# Patient Record
Sex: Female | Born: 1970 | Race: Black or African American | Hispanic: No | Marital: Single | Smoking: Never smoker
Health system: Southern US, Community
[De-identification: ages and names within clinical notes are randomized; demographics above are authoritative.]

## PROBLEM LIST (undated history)

## (undated) DIAGNOSIS — Z789 Other specified health status: Secondary | ICD-10-CM

## (undated) DIAGNOSIS — D649 Anemia, unspecified: Secondary | ICD-10-CM

## (undated) HISTORY — PX: TUBAL LIGATION: SHX77

## (undated) HISTORY — PX: CARPAL TUNNEL RELEASE: SHX101

---

## 2003-02-04 ENCOUNTER — Other Ambulatory Visit: Admission: RE | Admit: 2003-02-04 | Discharge: 2003-02-04 | Payer: Self-pay | Admitting: Obstetrics and Gynecology

## 2003-02-25 ENCOUNTER — Encounter: Payer: Self-pay | Admitting: Obstetrics and Gynecology

## 2003-02-25 ENCOUNTER — Encounter: Admission: RE | Admit: 2003-02-25 | Discharge: 2003-02-25 | Payer: Self-pay | Admitting: Obstetrics and Gynecology

## 2004-04-19 ENCOUNTER — Other Ambulatory Visit: Admission: RE | Admit: 2004-04-19 | Discharge: 2004-04-19 | Payer: Self-pay | Admitting: Obstetrics and Gynecology

## 2005-01-24 ENCOUNTER — Emergency Department (HOSPITAL_COMMUNITY): Admission: EM | Admit: 2005-01-24 | Discharge: 2005-01-24 | Payer: Self-pay | Admitting: *Deleted

## 2010-01-21 ENCOUNTER — Ambulatory Visit (HOSPITAL_BASED_OUTPATIENT_CLINIC_OR_DEPARTMENT_OTHER): Admission: RE | Admit: 2010-01-21 | Discharge: 2010-01-21 | Payer: Self-pay | Admitting: Orthopedic Surgery

## 2011-02-24 LAB — POCT HEMOGLOBIN-HEMACUE: Hemoglobin: 9.1 g/dL — ABNORMAL LOW (ref 12.0–15.0)

## 2011-04-28 ENCOUNTER — Other Ambulatory Visit: Payer: Self-pay | Admitting: Emergency Medicine

## 2011-05-03 ENCOUNTER — Ambulatory Visit
Admission: RE | Admit: 2011-05-03 | Discharge: 2011-05-03 | Disposition: A | Discharge status: Home / Self Care | Payer: BC Managed Care – PPO | Source: Ambulatory Visit | Attending: Emergency Medicine | Admitting: Emergency Medicine

## 2011-11-18 ENCOUNTER — Encounter (HOSPITAL_COMMUNITY): Payer: Self-pay | Admitting: Pharmacist

## 2011-11-25 NOTE — H&P (Signed)
  H&P DIctated 11/25/11 #[id number] DL

## 2011-11-26 NOTE — H&P (Addendum)
NAME:  Jennifer Tate, Jennifer Tate                ACCOUNT NO.:  [id number]  MEDICAL RECORD NO.:  [id number]  LOCATION:  PERIO                         FACILITY:  [facility]  PHYSICIAN:  Dineen Kid. Rana Snare, M.D.    DATE OF BIRTH:  1971/05/28  DATE OF ADMISSION:  10/25/2011 DATE OF DISCHARGE:                             HISTORY & PHYSICAL   She is going to be admitted [facility] on December 08, 2011.  HISTORY OF PRESENT ILLNESS:  Ms. Bare is a 40 year old, G2, P2, black female, status post tubal ligation with worsening problems of menorrhagia, pelvic pain, anemia, and fibroids, currently on intensive iron therapy, underwent saline infusion ultrasound showing multiple fibroids, some with submucosal component.  She desires definitive surgical intervention and presents for hysterectomy.  She states that when she does have menstrual cycle it is almost like a faucet with very heavy irregular bleeding.  She has been anemic for this for some time.  PAST MEDICAL HISTORY:  Significant for anemia.  PAST SURGICAL HISTORY:  She has had 2 cesarean sections and a tubal ligation.  She has also had carpal tunnel surgery and wisdom tooth extraction.  Currently, she is on prenatal vitamins and iron.  She has no known drug allergies.  PHYSICAL EXAMINATION:  VITAL SIGNS:  Her blood pressure is 132/80. HEART:  Regular rate and rhythm. LUNGS:  Clear to auscultation bilaterally. ABDOMEN:  Nondistended and nontender. GENITOURINARY:  Normal external genitalia, Bartholin, Skene's, urethra. Uterus is anteverted and mobile, approximately 8 week size.  Irregular surface.  No adnexal mass palpable.  IMPRESSION:  Menorrhagia, dysmenorrhea, fibroids, and anemia.  Desires definitive surgical intervention.  PLAN:  Laparoscopic-assisted vaginal hysterectomy, possible abdominal hysterectomy.  She does want preservation of the ovaries.  I discussed the procedure at length, its risks, benefits, the probability of success.  She does  want to proceed.  The risks include but not limited to risk of infection, bleeding, damage to the ureters, the ovaries, the bowel, and the bladder, risks associated with anesthesia and blood transfusion.  She does give her informed consent and wished to proceed.     Dineen Kid Rana Snare, M.D.     DCL/MEDQ  D:  11/25/2011  T:  11/26/2011  Job:  [id number]  12/07/10 0714 am Pt seen and examined.  H&P is reviewed and is current DL

## 2011-12-02 ENCOUNTER — Encounter (HOSPITAL_COMMUNITY)
Admission: RE | Admit: 2011-12-02 | Discharge: 2011-12-02 | Disposition: A | Discharge status: Home / Self Care | Payer: BC Managed Care – PPO | Source: Ambulatory Visit | Attending: Obstetrics and Gynecology | Admitting: Obstetrics and Gynecology

## 2011-12-02 ENCOUNTER — Encounter (HOSPITAL_COMMUNITY): Payer: Self-pay

## 2011-12-02 HISTORY — DX: Anemia, unspecified: D64.9

## 2011-12-02 HISTORY — DX: Other specified health status: Z78.9

## 2011-12-02 LAB — CBC
HCT: 33 % — ABNORMAL LOW (ref 36.0–46.0)
Hemoglobin: 10.5 g/dL — ABNORMAL LOW (ref 12.0–15.0)
MCH: 26.4 pg (ref 26.0–34.0)
MCHC: 31.8 g/dL (ref 30.0–36.0)
RBC: 3.97 MIL/uL (ref 3.87–5.11)

## 2011-12-02 LAB — SURGICAL PCR SCREEN: Staphylococcus aureus: NEGATIVE

## 2011-12-02 NOTE — Patient Instructions (Signed)
YOUR PROCEDURE IS SCHEDULED ON:12/08/11 ENTER THROUGH THE MAIN ENTRANCE OF [facility] AT:6am  USE DESK PHONE AND DIAL [phone] TO INFORM us OF YOUR ARRIVAL  CALL [phone] IF YOU HAVE ANY QUESTIONS OR PROBLEMS PRIOR TO YOUR ARRIVAL.  REMEMBER: DO NOT EAT OR DRINK AFTER MIDNIGHT : Wed  SPECIAL INSTRUCTIONS:none   YOU MAY BRUSH YOUR TEETH THE MORNING OF SURGERY   TAKE THESE MEDICINES THE DAY OF SURGERY WITH SIP OF WATER:none   DO NOT WEAR JEWELRY, EYE MAKEUP, LIPSTICK OR DARK FINGERNAIL POLISH DO NOT WEAR LOTIONS  DO NOT SHAVE FOR 48 HOURS PRIOR TO SURGERY  YOU WILL NOT BE ALLOWED TO DRIVE YOURSELF HOME.  NAME OF DRIVER:mother- Ree Kida

## 2011-12-07 MED ORDER — DEXTROSE 5 % IV SOLN
2.0000 g | INTRAVENOUS | Status: AC
  Administered 2011-12-08: 2 g via INTRAVENOUS
  Filled 2011-12-07: qty 2

## 2011-12-08 ENCOUNTER — Encounter (HOSPITAL_COMMUNITY): Payer: Self-pay | Admitting: Anesthesiology

## 2011-12-08 ENCOUNTER — Encounter (HOSPITAL_COMMUNITY): Payer: Self-pay | Admitting: *Deleted

## 2011-12-08 ENCOUNTER — Encounter (HOSPITAL_COMMUNITY)
Admission: RE | Disposition: A | Discharge status: Home / Self Care | Payer: Self-pay | Source: Ambulatory Visit | Attending: Obstetrics and Gynecology

## 2011-12-08 ENCOUNTER — Ambulatory Visit (HOSPITAL_COMMUNITY)
Admission: RE | Admit: 2011-12-08 | Discharge: 2011-12-09 | Disposition: A | Discharge status: Home / Self Care | Payer: BC Managed Care – PPO | Source: Ambulatory Visit | Attending: Obstetrics and Gynecology | Admitting: Obstetrics and Gynecology

## 2011-12-08 ENCOUNTER — Ambulatory Visit (HOSPITAL_COMMUNITY): Payer: BC Managed Care – PPO | Admitting: Anesthesiology

## 2011-12-08 ENCOUNTER — Other Ambulatory Visit: Payer: Self-pay | Admitting: Obstetrics and Gynecology

## 2011-12-08 DIAGNOSIS — N946 Dysmenorrhea, unspecified: Secondary | ICD-10-CM | POA: Insufficient documentation

## 2011-12-08 DIAGNOSIS — D251 Intramural leiomyoma of uterus: Secondary | ICD-10-CM | POA: Insufficient documentation

## 2011-12-08 DIAGNOSIS — N920 Excessive and frequent menstruation with regular cycle: Secondary | ICD-10-CM | POA: Insufficient documentation

## 2011-12-08 DIAGNOSIS — D649 Anemia, unspecified: Secondary | ICD-10-CM | POA: Insufficient documentation

## 2011-12-08 DIAGNOSIS — Z01818 Encounter for other preprocedural examination: Secondary | ICD-10-CM | POA: Insufficient documentation

## 2011-12-08 DIAGNOSIS — Z01812 Encounter for preprocedural laboratory examination: Secondary | ICD-10-CM | POA: Insufficient documentation

## 2011-12-08 DIAGNOSIS — N736 Female pelvic peritoneal adhesions (postinfective): Secondary | ICD-10-CM | POA: Insufficient documentation

## 2011-12-08 DIAGNOSIS — N8000 Endometriosis of the uterus, unspecified: Secondary | ICD-10-CM | POA: Insufficient documentation

## 2011-12-08 DIAGNOSIS — D252 Subserosal leiomyoma of uterus: Secondary | ICD-10-CM | POA: Insufficient documentation

## 2011-12-08 DIAGNOSIS — Z90710 Acquired absence of both cervix and uterus: Secondary | ICD-10-CM

## 2011-12-08 DIAGNOSIS — D250 Submucous leiomyoma of uterus: Secondary | ICD-10-CM | POA: Insufficient documentation

## 2011-12-08 HISTORY — PX: LAPAROSCOPIC ASSISTED VAGINAL HYSTERECTOMY: SHX5398

## 2011-12-08 SURGERY — HYSTERECTOMY, VAGINAL, LAPAROSCOPY-ASSISTED
Anesthesia: General | Site: Abdomen | Wound class: Clean Contaminated

## 2011-12-08 MED ORDER — LIDOCAINE HCL (CARDIAC) 20 MG/ML IV SOLN
INTRAVENOUS | Status: DC | PRN
  Administered 2011-12-08: 60 mg via INTRAVENOUS

## 2011-12-08 MED ORDER — NEOSTIGMINE METHYLSULFATE 1 MG/ML IJ SOLN
INTRAMUSCULAR | Status: DC | PRN
  Administered 2011-12-08: 3 mg via INTRAVENOUS
  Administered 2011-12-08: 2 mg via INTRAVENOUS

## 2011-12-08 MED ORDER — DEXAMETHASONE SODIUM PHOSPHATE 10 MG/ML IJ SOLN
INTRAMUSCULAR | Status: AC
  Filled 2011-12-08: qty 1

## 2011-12-08 MED ORDER — PROMETHAZINE HCL 25 MG/ML IJ SOLN
12.5000 mg | INTRAMUSCULAR | Status: DC | PRN
  Administered 2011-12-08: 12.5 mg via INTRAVENOUS
  Filled 2011-12-08: qty 1

## 2011-12-08 MED ORDER — NEOSTIGMINE METHYLSULFATE 1 MG/ML IJ SOLN
INTRAMUSCULAR | Status: AC
  Filled 2011-12-08: qty 10

## 2011-12-08 MED ORDER — ROCURONIUM BROMIDE 50 MG/5ML IV SOLN
INTRAVENOUS | Status: AC
  Filled 2011-12-08: qty 1

## 2011-12-08 MED ORDER — GLYCOPYRROLATE 0.2 MG/ML IJ SOLN
INTRAMUSCULAR | Status: AC
  Filled 2011-12-08: qty 1

## 2011-12-08 MED ORDER — LACTATED RINGERS IR SOLN
Status: DC | PRN
  Administered 2011-12-08: 3000 mL

## 2011-12-08 MED ORDER — ONDANSETRON HCL 4 MG/2ML IJ SOLN
INTRAMUSCULAR | Status: AC
  Filled 2011-12-08: qty 2

## 2011-12-08 MED ORDER — HYDROMORPHONE HCL PF 1 MG/ML IJ SOLN
INTRAMUSCULAR | Status: AC
  Administered 2011-12-08: 0.5 mg via INTRAVENOUS
  Filled 2011-12-08: qty 1

## 2011-12-08 MED ORDER — INDIGOTINDISULFONATE SODIUM 8 MG/ML IJ SOLN
INTRAMUSCULAR | Status: DC | PRN
  Administered 2011-12-08: 5 mL via INTRAVENOUS

## 2011-12-08 MED ORDER — BUPIVACAINE HCL (PF) 0.25 % IJ SOLN
INTRAMUSCULAR | Status: DC | PRN
  Administered 2011-12-08: 10 mL

## 2011-12-08 MED ORDER — MENTHOL 3 MG MT LOZG: 1.0000 | LOZENGE | OROMUCOSAL | Status: DC | PRN

## 2011-12-08 MED ORDER — DEXTROSE-NACL 5-0.45 % IV SOLN
INTRAVENOUS | Status: DC
  Administered 2011-12-08 – 2011-12-09 (×3): via INTRAVENOUS

## 2011-12-08 MED ORDER — ONDANSETRON HCL 4 MG/2ML IJ SOLN: 4.0000 mg | Freq: Four times a day (QID) | INTRAMUSCULAR | Status: DC | PRN

## 2011-12-08 MED ORDER — IBUPROFEN 600 MG PO TABS
600.0000 mg | ORAL_TABLET | Freq: Four times a day (QID) | ORAL | Status: DC | PRN
  Administered 2011-12-09 (×2): 600 mg via ORAL
  Filled 2011-12-08 (×2): qty 1

## 2011-12-08 MED ORDER — ONDANSETRON HCL 4 MG/2ML IJ SOLN
INTRAMUSCULAR | Status: DC | PRN
  Administered 2011-12-08: 4 mg via INTRAVENOUS

## 2011-12-08 MED ORDER — HYDROMORPHONE HCL PF 1 MG/ML IJ SOLN
0.2500 mg | INTRAMUSCULAR | Status: DC | PRN
  Administered 2011-12-08 (×3): 0.5 mg via INTRAVENOUS

## 2011-12-08 MED ORDER — NALOXONE HCL 0.4 MG/ML IJ SOLN: 0.4000 mg | INTRAMUSCULAR | Status: DC | PRN

## 2011-12-08 MED ORDER — LIDOCAINE HCL (CARDIAC) 20 MG/ML IV SOLN
INTRAVENOUS | Status: AC
  Filled 2011-12-08: qty 5

## 2011-12-08 MED ORDER — DEXAMETHASONE SODIUM PHOSPHATE 4 MG/ML IJ SOLN
INTRAMUSCULAR | Status: DC | PRN
  Administered 2011-12-08: 10 mg via INTRAVENOUS

## 2011-12-08 MED ORDER — FENTANYL CITRATE 0.05 MG/ML IJ SOLN
INTRAMUSCULAR | Status: DC | PRN
  Administered 2011-12-08 (×2): 100 ug via INTRAVENOUS
  Administered 2011-12-08: 50 ug via INTRAVENOUS

## 2011-12-08 MED ORDER — LACTATED RINGERS IV SOLN
INTRAVENOUS | Status: DC
  Administered 2011-12-08 (×2): via INTRAVENOUS

## 2011-12-08 MED ORDER — HYDROMORPHONE HCL PF 1 MG/ML IJ SOLN
INTRAMUSCULAR | Status: AC
  Filled 2011-12-08: qty 1

## 2011-12-08 MED ORDER — PROPOFOL 10 MG/ML IV EMUL
INTRAVENOUS | Status: DC | PRN
  Administered 2011-12-08: 20 mg via INTRAVENOUS
  Administered 2011-12-08: 160 mg via INTRAVENOUS
  Administered 2011-12-08: 20 mg via INTRAVENOUS

## 2011-12-08 MED ORDER — FENTANYL CITRATE 0.05 MG/ML IJ SOLN
INTRAMUSCULAR | Status: AC
  Filled 2011-12-08: qty 5

## 2011-12-08 MED ORDER — ZOLPIDEM TARTRATE 5 MG PO TABS: 5.0000 mg | ORAL_TABLET | Freq: Every evening | ORAL | Status: DC | PRN

## 2011-12-08 MED ORDER — HYDROMORPHONE 0.3 MG/ML IV SOLN
INTRAVENOUS | Status: AC
  Filled 2011-12-08: qty 25

## 2011-12-08 MED ORDER — ROCURONIUM BROMIDE 100 MG/10ML IV SOLN
INTRAVENOUS | Status: DC | PRN
  Administered 2011-12-08 (×2): 10 mg via INTRAVENOUS
  Administered 2011-12-08: 40 mg via INTRAVENOUS

## 2011-12-08 MED ORDER — INDIGOTINDISULFONATE SODIUM 8 MG/ML IJ SOLN
INTRAMUSCULAR | Status: AC
  Filled 2011-12-08: qty 5

## 2011-12-08 MED ORDER — DIPHENHYDRAMINE HCL 12.5 MG/5ML PO ELIX: 12.5000 mg | ORAL_SOLUTION | Freq: Four times a day (QID) | ORAL | Status: DC | PRN

## 2011-12-08 MED ORDER — GLYCOPYRROLATE 0.2 MG/ML IJ SOLN
INTRAMUSCULAR | Status: DC | PRN
  Administered 2011-12-08: .6 mg via INTRAVENOUS
  Administered 2011-12-08: .4 mg via INTRAVENOUS

## 2011-12-08 MED ORDER — OXYCODONE-ACETAMINOPHEN 5-325 MG PO TABS
1.0000 | ORAL_TABLET | ORAL | Status: DC | PRN
  Administered 2011-12-09 (×2): 1 via ORAL
  Filled 2011-12-08 (×3): qty 1

## 2011-12-08 MED ORDER — HYDROMORPHONE HCL PF 1 MG/ML IJ SOLN: 0.2000 mg | INTRAMUSCULAR | Status: DC | PRN

## 2011-12-08 MED ORDER — SODIUM CHLORIDE 0.9 % IJ SOLN: 9.0000 mL | INTRAMUSCULAR | Status: DC | PRN

## 2011-12-08 MED ORDER — HYDROMORPHONE 0.3 MG/ML IV SOLN
INTRAVENOUS | Status: DC
  Administered 2011-12-08: 0.3 mg via INTRAVENOUS
  Administered 2011-12-08: 12:00:00 via INTRAVENOUS
  Administered 2011-12-08: 1.5 mg via INTRAVENOUS

## 2011-12-08 MED ORDER — MIDAZOLAM HCL 2 MG/2ML IJ SOLN
INTRAMUSCULAR | Status: AC
  Filled 2011-12-08: qty 2

## 2011-12-08 MED ORDER — DIPHENHYDRAMINE HCL 50 MG/ML IJ SOLN: 12.5000 mg | Freq: Four times a day (QID) | INTRAMUSCULAR | Status: DC | PRN

## 2011-12-08 MED ORDER — PROPOFOL 10 MG/ML IV EMUL
INTRAVENOUS | Status: AC
  Filled 2011-12-08: qty 20

## 2011-12-08 MED ORDER — MIDAZOLAM HCL 5 MG/5ML IJ SOLN
INTRAMUSCULAR | Status: DC | PRN
  Administered 2011-12-08: 2 mg via INTRAVENOUS

## 2011-12-08 SURGICAL SUPPLY — 43 items
ADH SKN CLS APL DERMABOND .7 (GAUZE/BANDAGES/DRESSINGS) ×2
BLADE SURG 15 STRL LF C SS BP (BLADE) ×1 IMPLANT
BLADE SURG 15 STRL SS (BLADE) ×2
CABLE HIGH FREQUENCY MONO STRZ (ELECTRODE) IMPLANT
CATH ROBINSON RED A/P 16FR (CATHETERS) ×2 IMPLANT
CLOTH BEACON ORANGE TIMEOUT ST (SAFETY) ×2 IMPLANT
CONT PATH 16OZ SNAP LID 3702 (MISCELLANEOUS) ×2 IMPLANT
COVER MAYO STAND STRL (DRAPES) ×1 IMPLANT
COVER TABLE BACK 60X90 (DRAPES) ×2 IMPLANT
DECANTER SPIKE VIAL GLASS SM (MISCELLANEOUS) ×1 IMPLANT
DERMABOND ADVANCED (GAUZE/BANDAGES/DRESSINGS) ×2
DERMABOND ADVANCED .7 DNX12 (GAUZE/BANDAGES/DRESSINGS) IMPLANT
DRSG COVADERM PLUS 2X2 (GAUZE/BANDAGES/DRESSINGS) ×1 IMPLANT
ELECT LIGASURE LONG (ELECTRODE) ×2 IMPLANT
ELECT REM PT RETURN 9FT ADLT (ELECTROSURGICAL) ×2
ELECTRODE REM PT RTRN 9FT ADLT (ELECTROSURGICAL) IMPLANT
FORCEPS CUTTING 45CM 5MM (CUTTING FORCEPS) ×2 IMPLANT
GLOVE BIO SURGEON STRL SZ8 (GLOVE) ×2 IMPLANT
GLOVE BIOGEL PI IND STRL 6.5 (GLOVE) ×1 IMPLANT
GLOVE BIOGEL PI INDICATOR 6.5 (GLOVE) ×1
GLOVE SURG ORTHO 8.0 STRL STRW (GLOVE) ×6 IMPLANT
GOWN PREVENTION PLUS LG XLONG (DISPOSABLE) ×6 IMPLANT
NDL INSUFFLATION 14GA 120MM (NEEDLE) ×1 IMPLANT
NEEDLE INSUFFLATION 14GA 120MM (NEEDLE) ×2 IMPLANT
NS IRRIG 1000ML POUR BTL (IV SOLUTION) ×2 IMPLANT
PACK LAVH (CUSTOM PROCEDURE TRAY) ×2 IMPLANT
SET IRRIG TUBING LAPAROSCOPIC (IRRIGATION / IRRIGATOR) ×1 IMPLANT
SOLUTION ELECTROLUBE (MISCELLANEOUS) IMPLANT
STRIP CLOSURE SKIN 1/4X3 (GAUZE/BANDAGES/DRESSINGS) IMPLANT
SUT MNCRL 0 MO-4 VIOLET 18 CR (SUTURE) ×2 IMPLANT
SUT MNCRL 0 VIOLET 6X18 (SUTURE) ×1 IMPLANT
SUT MNCRL AB 0 CT1 27 (SUTURE) IMPLANT
SUT MON AB 2-0 CT1 36 (SUTURE) ×1 IMPLANT
SUT MONOCRYL 0 6X18 (SUTURE) ×1
SUT MONOCRYL 0 MO 4 18  CR/8 (SUTURE) ×1
SUT VICRYL 0 UR6 27IN ABS (SUTURE) ×2 IMPLANT
SUT VICRYL RAPIDE 3 0 (SUTURE) ×2 IMPLANT
TOWEL OR 17X24 6PK STRL BLUE (TOWEL DISPOSABLE) ×4 IMPLANT
TRAY FOLEY CATH 14FR (SET/KITS/TRAYS/PACK) ×2 IMPLANT
TROCAR Z-THREAD BLADED 11X100M (TROCAR) ×2 IMPLANT
TROCAR Z-THREAD BLADED 5X100MM (TROCAR) ×2 IMPLANT
WARMER LAPAROSCOPE (MISCELLANEOUS) ×2 IMPLANT
WATER STERILE IRR 1000ML POUR (IV SOLUTION) ×1 IMPLANT

## 2011-12-08 NOTE — Anesthesia Procedure Notes (Signed)
Procedure Name: Intubation Date/Time: 12/08/2011 7:31 AM Performed by: Isabella Bowens Pre-anesthesia Checklist: Patient identified, Emergency Drugs available, Suction available, Patient being monitored and Timeout performed Patient Re-evaluated:Patient Re-evaluated prior to inductionOxygen Delivery Method: Circle System Utilized Preoxygenation: Pre-oxygenation with 100% oxygen Intubation Type: IV induction Ventilation: Mask ventilation without difficulty Laryngoscope Size: Mac and 3 Grade View: Grade II Tube type: Oral Tube size: 7.0 mm Number of attempts: 1 Airway Equipment and Method: stylet Placement Confirmation: ETT inserted through vocal cords under direct vision,  positive ETCO2 and breath sounds checked- equal and bilateral Secured at: 20 cm Tube secured with: Tape Dental Injury: Teeth and Oropharynx as per pre-operative assessment  Difficulty Due To: Difficulty was unanticipated

## 2011-12-08 NOTE — Anesthesia Postprocedure Evaluation (Signed)
Anesthesia Post Note  Patient: Jennifer Tate  Procedure(s) Performed:  LAPAROSCOPIC ASSISTED VAGINAL HYSTERECTOMY  Anesthesia type: General  Patient location: PACU  Post pain: Pain level controlled  Post assessment: Post-op Vital signs reviewed  Last Vitals:  Filed Vitals:   12/08/11 1045  BP:   Pulse: 68  Temp: 36.8 C  Resp: 18    Post vital signs: Reviewed  Level of consciousness: sedated  Complications: No apparent anesthesia complications

## 2011-12-08 NOTE — Transfer of Care (Signed)
Immediate Anesthesia Transfer of Care Note  Patient: Jennifer Tate  Procedure(s) Performed:  LAPAROSCOPIC ASSISTED VAGINAL HYSTERECTOMY  Patient Location: PACU  Anesthesia Type: General  Level of Consciousness: awake and oriented  Airway & Oxygen Therapy: Patient Spontanous Breathing and Patient connected to nasal cannula oxygen  Post-op Assessment: Report given to PACU RN and Post -op Vital signs reviewed and stable  Post vital signs: Reviewed and stable  Complications: No apparent anesthesia complications

## 2011-12-08 NOTE — Op Note (Addendum)
Op note dictated [phone] AUB, Fibroids, pelvic pain, anemia LAVH, LOA Rana Snare, Neal EBL 200cc Findings:  Extensive pevlic adhesions, fibroids Complications:  None Drain:  Foley DL

## 2011-12-08 NOTE — Progress Notes (Signed)
Day of Surgery Procedure(s): LAPAROSCOPIC ASSISTED VAGINAL HYSTERECTOMY  Subjective: Patient reports  Doing well    Objective: I have reviewed patient's vital signs, intake and output and medications.  General: alert, cooperative and no distress  Assessment: s/p Procedure(s): LAPAROSCOPIC ASSISTED VAGINAL HYSTERECTOMY: stable and progressing well  Plan: Advance diet Encourage ambulation  LOS: 0 days    Xiadani Damman C 12/08/2011, 6:55 PM

## 2011-12-08 NOTE — Anesthesia Preprocedure Evaluation (Signed)
Anesthesia Evaluation  Patient identified by MRN, date of birth, ID band Patient awake    Reviewed: Allergy & Precautions, H&P , Patient's Chart, lab work & pertinent test results, reviewed documented beta blocker date and time   Airway Mallampati: II TM Distance: >3 FB Neck ROM: full    Dental No notable dental hx.    Pulmonary  clear to auscultation  Pulmonary exam normal       Cardiovascular regular Normal    Neuro/Psych    GI/Hepatic   Endo/Other    Renal/GU      Musculoskeletal   Abdominal   Peds  Hematology   Anesthesia Other Findings   Reproductive/Obstetrics                           Anesthesia Physical Anesthesia Plan  ASA: II  Anesthesia Plan: General   Post-op Pain Management:    Induction: Intravenous  Airway Management Planned: Oral ETT  Additional Equipment:   Intra-op Plan:   Post-operative Plan:   Informed Consent: I have reviewed the patients History and Physical, chart, labs and discussed the procedure including the risks, benefits and alternatives for the proposed anesthesia with the patient or authorized representative who has indicated his/her understanding and acceptance.   Dental Advisory Given and Dental advisory given  Plan Discussed with: CRNA and Surgeon  Anesthesia Plan Comments: (  Discussed  general anesthesia, including possible nausea, instrumentation of airway, sore throat,pulmonary aspiration, etc. I asked if the were any outstanding questions, or  concerns before we proceeded. )        Anesthesia Quick Evaluation  

## 2011-12-09 LAB — CBC
Hemoglobin: 8.4 g/dL — ABNORMAL LOW (ref 12.0–15.0)
MCH: 25.7 pg — ABNORMAL LOW (ref 26.0–34.0)
MCHC: 30.7 g/dL (ref 30.0–36.0)
MCV: 83.8 fL (ref 78.0–100.0)
RBC: 3.27 MIL/uL — ABNORMAL LOW (ref 3.87–5.11)

## 2011-12-09 MED ORDER — OXYCODONE-ACETAMINOPHEN 5-500 MG PO CAPS: 1.0000 | ORAL_CAPSULE | ORAL | Status: AC | PRN

## 2011-12-09 NOTE — Op Note (Signed)
NAMENIAMBI, SMOAK                ACCOUNT NO.:  [id number]  MEDICAL RECORD NO.:  [id number]  LOCATION:  9306                          FACILITY:  [facility]  PHYSICIAN:  Dineen Kid. Rana Snare, M.D.    DATE OF BIRTH:  [birth year]  DATE OF PROCEDURE:  12/08/2011 DATE OF DISCHARGE:                              OPERATIVE REPORT   PREOPERATIVE DIAGNOSES:  Menorrhagia, dysmenorrhea, fibroids, and anemia.  POSTOP DIAGNOSIS:  Menorrhagia, dysmenorrhea, fibroids, anemia, pelvic adhesions.  SURGEON:  Dineen Kid. Rana Snare, MD  ASSISTANT:  Freddy Finner, M.D.  ANESTHESIA:  General endotracheal.  PROCEDURE:  Laparoscopic-assisted vaginal hysterectomy with extensive lysis of pelvic adhesions.  INDICATIONS:  Ms. Prashad is a 41 year old, G2, P2, black female, status post tubal ligation, worsening problems, menorrhagia, pelvic pain, anemia, fibroids, currently on iron therapy.  Underwent saline infusion ultrasound showing multiple fibroids, some with a submucosal component. She desires definitive surgical intervention and presents for hysterectomy.  She states that when she has a menstrual cycle, it is almost like a faucet, very irregular heavy bleeding.  The risks and benefits of procedure were discussed at length and informed consent was obtained.  FINDINGS AT TIME OF SURGERY:  Enlarged uterus consistent with 8 week size fibroid uterus.  Adhesions from the omentum to the anterior abdominal wall and omentum to the fundus down through the anterior surface over the bladder and the uterus.  Also the right side of the uterus is densely adhered to the right pelvic sidewall, normal-appearing appendix, normal-appearing liver.  DESCRIPTION OF PROCEDURE:  After adequate analgesia, the patient was placed in dorsal lithotomy position.  She was sterilely prepped and draped.  The bladder was sterilely drained.  Graves speculum was placed. A Hulka tenaculum was placed on the cervix.  A 1 cm infraumbilical skin incision  was made.  A Veress needle was inserted.  The abdomen was insufflated with dullness to percussion and 11 mm trocar was inserted. The laparoscope was inserted.  The above findings were noted.  A 5 mm trocar was inserted to the left of midline 2 fingerbreadths above the pubic symphysis under direct visualization.  Careful dissection using Gyrus cutting forceps were used to dissect and cut the omentum from the anterior abdominal wall down to the level of the uterus, care taken to avoid any injury to the bowel and achieved good hemostasis.  The omentum was then dissected off the anterior surface of the uterus down to the level of the bladder until the anterior surface was cleaned from bowel and omentum adhesions.  Again care taken to avoid injury to the bowel. The left utero-ovarian ligament was identified, ligated with Gyrus cutting forceps, dissected across the utero-ovarian ligament, down across the round ligament to the inferior portion of broad ligament of the left tube and ovary following laterally with good hemostasis achieved and care taken to avoid the underlying ureter.  The right utero- ovarian ligament was dissected, ligated, and with the right ovary following bilaterally.  The broad ligament, however, was densely adhered to the pelvic sidewall.  So careful dissection using care to stay along the uterine border using Gyrus cutting forceps were used to cut and dissect along the uterus  down along the broad ligament, freeing the right side of the uterus from the pelvic sidewall.  Care taken to avoid the underlying vessels and underlying ureter.  The uterovesical junction was then identified.  A bladder flap was created.  The abdomen was then desufflated, the legs repositioned, weighted speculum placed in the vagina.  The posterior colpotomy was performed.  The cervix was then circumscribed with Bovie cautery.  A long weighted speculum was then placed.  LigaSure instrument was used to  ligate across the uterosacral ligaments bilaterally, cardinal ligaments bilaterally, bladder pillars bilaterally.  The anterior vaginal mucosa was then dissected off the anterior surface of the cervix and anterior peritoneum was entered sharply and a Deaver retractor was placed under the bladder.  The patient was given indigo carmine and Foley catheter placed with good return of clear blue urine and no evidence of any injury to the bladder. The inferior portions of the broad ligament were then ligated with LigaSure instrument, dissected with the Mayo scissors.  The uterus was removed.  Posterior peritoneum was closed in a pursestring fashion using 0 Monocryl suture.  The uterosacral ligaments were identified, ligated with 0 Monocryl suture in a figure-of-eight fashion.  The fascial mucosa was then closed in a vertical fashion using figure-of-eights of 0 Monocryl suture plicating the uterosacral ligaments in the midline and achieved good hemostasis.  The legs were repositioned.  Abdomen reinsufflated.  A Nezhat suction irrigator was used to irrigate the abdomen and pelvis.  Good hemostasis was achieved.  The ovaries appeared to be normal.  No injury to bowel or bladder was noted.  The ureters were identified bilaterally and noted to be good peristalsis.  Small peritoneal edge bleeders were coagulated with bipolar cautery.  After copious amount of irrigation, adequate hemostasis was assured.  The abdomen was desufflated, trocars removed.  The infraumbilical skin incision was closed with 0 Vicryl interrupted suture of the fascia, 3-0 Vicryl Rapide subcuticular suture.  The 5 mm site was closed with 3-0 Vicryl Rapide subcuticular suture with Dermabond.  The incisions were then injected with 0.25% Marcaine, total of 10 mL used.  The patient was transferred to recovery room in stable condition.  Sponge and instrument count was normal x3.  Estimated blood loss 200 mL.  The patient received 2 g  of cefotetan preoperatively.     Dineen Kid Rana Snare, M.D.     DCL/MEDQ  D:  12/08/2011  T:  12/09/2011  Job:  [id number]

## 2011-12-09 NOTE — Anesthesia Postprocedure Evaluation (Signed)
  Anesthesia Post-op Note  Patient: Jennifer Tate  Procedure(s) Performed:  LAPAROSCOPIC ASSISTED VAGINAL HYSTERECTOMY  Patient Location: PACU and Women's Unit  Anesthesia Type: General  Level of Consciousness: awake, alert , oriented, patient cooperative and responds to stimulation  Airway and Oxygen Therapy: Patient Spontanous Breathing  Post-op Pain: mild  Post-op Assessment: Post-op Vital signs reviewed, Patient's Cardiovascular Status Stable, Respiratory Function Stable, No signs of Nausea or vomiting and Pain level controlled  Post-op Vital Signs: stable  Complications: No apparent anesthesia complications

## 2011-12-09 NOTE — Discharge Summary (Signed)
Physician Discharge Summary  Patient ID: CAYCI MCNABB MRN: [id number] DOB/AGE: 02/28/1971 41 y.o.  Admit date: 12/08/2011 Discharge date: 12/09/2011  Admission Diagnoses:Fibroids, AUB, Dysmenorrhea  Discharge Diagnoses: Same plus pelvic adhesiions Active Problems:  * No active hospital problems. *    Discharged Condition: good  Hospital Course: Pt was admitted and underwent a LAVH with lysis of adhesions.  Her procedure was uncomplicated.  Her postop care was also unremarkable as she had good return of bowel function, voiding, tolerating po well and passing flatus and pain well controlled on po meds by HD#!Marland Kitchen  Her incisions were clean and intact and HGB Stable.  Consults: none  Significant Diagnostic Studies:   Treatments: IV hydration, antibiotics: cefotetan and surgery: LAVH  Discharge Exam: Blood pressure 111/65, pulse 93, temperature 99.7 F (37.6 C), temperature source Oral, resp. rate 18, height 4\' 11"  (1.499 m), weight 75.297 kg (166 lb), last menstrual period 12/07/2011, SpO2 97.00%. Incision/Wound:  Clean dry and intact with BS+  Disposition: Home with FU 2 weeks  Discharge Orders    Future Orders Please Complete By Expires   Diet general      Increase activity slowly      Call MD for:  temperature >100.4      Call MD for:  persistant nausea and vomiting      Call MD for:  severe uncontrolled pain      Call MD for:  redness, tenderness, or signs of infection (pain, swelling, redness, odor or green/yellow discharge around incision site)      Call MD for:  difficulty breathing, headache or visual disturbances      Discharge instructions      Comments:   Follow up in the office as scheduled in 2 weeks     Medication List  As of 12/09/2011  2:43 PM   START taking these medications         oxyCODONE-acetaminophen 5-500 MG per capsule   Commonly known as: TYLOX   Take 1 capsule by mouth every 4 (four) hours as needed for pain.         CONTINUE taking these  medications         ferrous sulfate 325 (65 FE) MG tablet          Where to get your medications    These are the prescriptions that you need to pick up.   You may get these medications from any pharmacy.         oxyCODONE-acetaminophen 5-500 MG per capsule             Signed: Izmael Duross C 12/09/2011, 2:43 PM

## 2011-12-09 NOTE — Progress Notes (Signed)
1 Day Post-Op Procedure(s): LAPAROSCOPIC ASSISTED VAGINAL HYSTERECTOMY  Subjective: Patient reports tolerating PO, + flatus and no problems voiding.    Objective: I have reviewed patient's vital signs, intake and output, medications and labs.  General: alert, cooperative, icteric and no distress GI: soft, non-tender; bowel sounds normal; no masses,  no organomegaly and normal findings:  Vaginal Bleeding: none and minimal  Assessment: s/p Procedure(s): LAPAROSCOPIC ASSISTED VAGINAL HYSTERECTOMY: stable, progressing well and tolerating diet  Plan: Advance to PO medication Discontinue IV fluids Discharge home  LOS: 1 day    Jennifer Tate C 12/09/2011, 10:01 AM

## 2011-12-09 NOTE — Addendum Note (Signed)
Addendum  created 12/09/11 0755 by Truitt Leep, CRNA   Modules edited:Notes Section

## 2011-12-12 ENCOUNTER — Encounter (HOSPITAL_COMMUNITY): Payer: Self-pay | Admitting: Obstetrics and Gynecology

## 2012-02-23 ENCOUNTER — Telehealth: Payer: Self-pay | Admitting: Hematology and Oncology

## 2012-02-23 NOTE — Telephone Encounter (Signed)
S/w pt re appt appt for 3/22 @ 9:30 am w/LO.

## 2012-02-23 NOTE — Telephone Encounter (Signed)
Referred by Dr. Candice Camp Dx- Anemia

## 2012-02-24 ENCOUNTER — Encounter: Payer: Self-pay | Admitting: Hematology and Oncology

## 2012-02-24 ENCOUNTER — Ambulatory Visit (HOSPITAL_BASED_OUTPATIENT_CLINIC_OR_DEPARTMENT_OTHER): Payer: BC Managed Care – PPO | Admitting: Hematology and Oncology

## 2012-02-24 ENCOUNTER — Telehealth: Payer: Self-pay | Admitting: Hematology and Oncology

## 2012-02-24 ENCOUNTER — Ambulatory Visit: Payer: BC Managed Care – PPO

## 2012-02-24 ENCOUNTER — Ambulatory Visit (HOSPITAL_BASED_OUTPATIENT_CLINIC_OR_DEPARTMENT_OTHER): Payer: BC Managed Care – PPO | Admitting: Lab

## 2012-02-24 VITALS — BP 111/75 | HR 72 | Temp 98.5°F | Ht 61.0 in | Wt 170.3 lb

## 2012-02-24 DIAGNOSIS — Z90710 Acquired absence of both cervix and uterus: Secondary | ICD-10-CM

## 2012-02-24 DIAGNOSIS — N920 Excessive and frequent menstruation with regular cycle: Secondary | ICD-10-CM

## 2012-02-24 DIAGNOSIS — D509 Iron deficiency anemia, unspecified: Secondary | ICD-10-CM

## 2012-02-24 DIAGNOSIS — D539 Nutritional anemia, unspecified: Secondary | ICD-10-CM

## 2012-02-24 LAB — COMPREHENSIVE METABOLIC PANEL
ALT: 9 U/L (ref 0–35)
AST: 13 U/L (ref 0–37)
Albumin: 4.2 g/dL (ref 3.5–5.2)
Alkaline Phosphatase: 82 U/L (ref 39–117)
Calcium: 9.5 mg/dL (ref 8.4–10.5)
Chloride: 105 mEq/L (ref 96–112)
Potassium: 4.3 mEq/L (ref 3.5–5.3)
Sodium: 137 mEq/L (ref 135–145)
Total Protein: 7.7 g/dL (ref 6.0–8.3)

## 2012-02-24 LAB — CBC WITH DIFFERENTIAL/PLATELET
BASO%: 1 % (ref 0.0–2.0)
EOS%: 0.6 % (ref 0.0–7.0)
HCT: 34.5 % — ABNORMAL LOW (ref 34.8–46.6)
MCH: 27.1 pg (ref 25.1–34.0)
MCHC: 32.2 g/dL (ref 31.5–36.0)
MONO#: 0.3 10*3/uL (ref 0.1–0.9)
NEUT%: 62.5 % (ref 38.4–76.8)
RBC: 4.1 10*6/uL (ref 3.70–5.45)
RDW: 15 % — ABNORMAL HIGH (ref 11.2–14.5)
WBC: 5.2 10*3/uL (ref 3.9–10.3)
lymph#: 1.6 10*3/uL (ref 0.9–3.3)
nRBC: 0 % (ref 0–0)

## 2012-02-24 LAB — FERRITIN: Ferritin: 14 ng/mL (ref 10–291)

## 2012-02-24 LAB — URINALYSIS, MICROSCOPIC - CHCC
Glucose: NEGATIVE g/dL
Ketones: NEGATIVE mg/dL
Nitrite: NEGATIVE
Protein: NEGATIVE mg/dL
Specific Gravity, Urine: 1.015 (ref 1.003–1.035)

## 2012-02-24 LAB — IRON AND TIBC: UIBC: 359 ug/dL (ref 125–400)

## 2012-02-24 NOTE — Progress Notes (Signed)
This office note has been dictated.

## 2012-02-24 NOTE — Progress Notes (Signed)
CC:   Jennifer Tate. Jennifer Tate, M.D.  IDENTIFYING STATEMENT:  The patient is a 41 year old woman seen at the request of Dr. Rana Tate with anemia.  HISTORY OF PRESENT ILLNESS:  The patient is of general good medical health but has had a long history for menorrhagia with fibroids and anemia.  Reviewing the chart she had a CBC drawn at around the time of her surgery and was found to have a hemoglobin and hematocrit of 8.4 and 37.4 respectively.  On 12/08/2011 she underwent a lap assisted vaginal partial hysterectomy with lysis of extensive lesions.  She reports she had some bleeding postop.  She was placed on oral iron and she is currently taking over-the-counter iron twice daily.  It makes her feel nauseous with constipation.  She feels very physically tired especially at the end of the day.  She has not had any further issues with bleeding.  She denies melena.  Her weight is stable.  A recent CBC on 02/21/2012 noted a white cell count of 4.2, hemoglobin 11.2, hematocrit 36.8, platelets 416.  Iron stores noted an iron of 50, TIBC 53.  PAST MEDICAL HISTORY: 1. Anemia. 2. Status post vaginal hysterectomy. 3. History of right carpal tunnel surgery.  MEDICATIONS:  Ferrous sulfate 325 b.i.d.  ALLERGIES:  None.  SOCIAL HISTORY:  Denies alcohol, tobacco use.  The patient is single with 2 children.  She works at [employer].  FAMILY HISTORY:  Negative for oncologic or hematologic malignancies. Mother and cousins had anemia all secondary to menorrhagia and fibroids.  REVIEW OF SYSTEMS:  Denies fever, chills, night sweats, anorexia, weight loss.  GI:  Denies nausea, vomiting, abdominal pain, diarrhea, melena, hematochezia.  GU:  Denies dysuria, hematuria, nocturia, frequency. Cardiovascular:  Denies chest pain, PND, orthopnea, ankle swelling. Respiratory:  Denies cough, hemoptysis, wheeze, shortness of breath. Skin:  No bruising or bleeding.  PHYSICAL EXAMINATION:  General:  The patient is a well-appearing,  well- nourished woman in no distress.  Vitals:  Pulse 72, blood pressure 101/75, temperature 98.5, respirations 20, weight 170 pounds.  HEENT: Head is atraumatic, normocephalic.  Sclerae anicteric.  Pupils equal, round, reactive to light.  Mouth moist without ulcerations, thrush or lesions.  Neck:  Supple without adenopathy.  Chest:  Demonstrates good entry bilaterally and is clear to both percussion and auscultation. Cardiovascular:  Reveals first and second heart sounds present, no added sounds or murmurs.  Abdomen:  Soft, nontender.  No hepatomegaly.  No masses.  Extremities:  No calf tenderness.  Pulses present and symmetrical.  Lymph nodes:  No palpable cervical, axillary, inguinal adenopathy.  CNS:  Nonfocal.  IMPRESSION AND PLAN:  Jennifer Tate is a pleasant 41 year old woman with iron deficiency anemia felt to be secondary to menorrhagia.  She has undergone a partial vaginal hysterectomy on 12/08/2011.  She is on oral iron, which is causing some GI upset.  She may be a candidate for IV iron, but before doing so I would like for her to return to lab to repeat a CBC and obtain iron studies particularly ferritin level and if her ferritin level is low enough she will be invited to receive IV iron; if not, will recommend that she continue with iron as she is doing.  We will also review her morphology and obtain urinalysis.  I also suggested fecal occult blood testing x3.  I also encouraged well-balanced diet.  I spent more than half the time discussing etiology and management.  We also discussed the side effects of IV iron, which  is primarily infusional.  She had a number of questions which I answered.  I spent more than half the time coordinating care.    ______________________________ Jennifer Tate, M.D. LIO/MEDQ  D:  02/24/2012  T:  02/24/2012  Job:  [id number]

## 2012-02-24 NOTE — Patient Instructions (Signed)
Patient to follow up as instructed.   Current Outpatient Prescriptions  Medication Sig Dispense Refill  . ferrous sulfate 325 (65 FE) MG tablet Take 325 mg by mouth 2 (two) times daily.              March 2013  Sunday Monday Tuesday Wednesday Thursday Friday Saturday                  1   2    3   4   5   6   7   8   9    10   11   12   13   14   15   16    17   18   19   20   21   22    FINANCIAL COUNSELING   9:30 AM  (30 min.)  [facility]  [facility]   NEW PATIENT 60  10:00 AM  (60 min.)  Laurice Record, MD  [facility] 23    24   25   26   27   28   29   30    31                            April 2013  Sunday Monday Tuesday Wednesday Thursday Friday Saturday      1   2   3   4   5   6    7   8   9   10   11   12   13    14   15   16   17   18   19   20    21  Happy Birthday!   22   23   24   25   26   27    28   29    30

## 2012-02-24 NOTE — Telephone Encounter (Signed)
sent pt to lab gv pt appt for 03/26.

## 2012-02-28 ENCOUNTER — Ambulatory Visit (HOSPITAL_BASED_OUTPATIENT_CLINIC_OR_DEPARTMENT_OTHER): Payer: BC Managed Care – PPO

## 2012-02-28 VITALS — BP 102/63 | HR 75 | Temp 98.6°F

## 2012-02-28 DIAGNOSIS — D539 Nutritional anemia, unspecified: Secondary | ICD-10-CM

## 2012-02-28 MED ORDER — SODIUM CHLORIDE 0.9 % IV SOLN
1020.0000 mg | Freq: Once | INTRAVENOUS | Status: AC
  Administered 2012-02-28: 1020 mg via INTRAVENOUS
  Filled 2012-02-28: qty 34

## 2012-02-28 MED ORDER — SODIUM CHLORIDE 0.9 % IV SOLN
Freq: Once | INTRAVENOUS | Status: AC
  Administered 2012-02-28: 09:00:00 via INTRAVENOUS

## 2012-02-28 NOTE — Progress Notes (Signed)
Patient provided with education regarding first treatment of feraheme and information to take home. Tolerated treatment well with no complaints. Educated to call office with any questions or concerns. Discharged and ambulating.

## 2012-09-17 ENCOUNTER — Other Ambulatory Visit (HOSPITAL_COMMUNITY): Payer: Self-pay | Admitting: Obstetrics and Gynecology

## 2012-09-17 DIAGNOSIS — E010 Iodine-deficiency related diffuse (endemic) goiter: Secondary | ICD-10-CM

## 2012-09-19 ENCOUNTER — Ambulatory Visit (HOSPITAL_COMMUNITY)
Admission: RE | Admit: 2012-09-19 | Discharge: 2012-09-19 | Disposition: A | Discharge status: Home / Self Care | Payer: BC Managed Care – PPO | Source: Ambulatory Visit | Attending: Obstetrics and Gynecology | Admitting: Obstetrics and Gynecology

## 2012-09-19 DIAGNOSIS — E010 Iodine-deficiency related diffuse (endemic) goiter: Secondary | ICD-10-CM

## 2012-09-19 DIAGNOSIS — E049 Nontoxic goiter, unspecified: Secondary | ICD-10-CM | POA: Insufficient documentation

## 2013-01-23 ENCOUNTER — Ambulatory Visit: Payer: BC Managed Care – PPO

## 2013-01-23 ENCOUNTER — Ambulatory Visit (INDEPENDENT_AMBULATORY_CARE_PROVIDER_SITE_OTHER): Payer: BC Managed Care – PPO | Admitting: Family Medicine

## 2013-01-23 VITALS — BP 101/70 | HR 67 | Temp 98.4°F | Resp 17 | Ht 61.0 in | Wt 169.0 lb

## 2013-01-23 DIAGNOSIS — D649 Anemia, unspecified: Secondary | ICD-10-CM

## 2013-01-23 DIAGNOSIS — M549 Dorsalgia, unspecified: Secondary | ICD-10-CM

## 2013-01-23 LAB — COMPREHENSIVE METABOLIC PANEL
ALT: 9 U/L (ref 0–35)
AST: 13 U/L (ref 0–37)
Albumin: 4 g/dL (ref 3.5–5.2)
CO2: 26 mEq/L (ref 19–32)
Calcium: 9.6 mg/dL (ref 8.4–10.5)
Chloride: 107 mEq/L (ref 96–112)
Creat: 0.7 mg/dL (ref 0.50–1.10)
Potassium: 4.3 mEq/L (ref 3.5–5.3)

## 2013-01-23 LAB — POCT CBC
Granulocyte percent: 50.9 %G (ref 37–80)
Hemoglobin: 11.5 g/dL — AB (ref 12.2–16.2)
MCH, POC: 29.8 pg (ref 27–31.2)
MCV: 92.1 fL (ref 80–97)
MID (cbc): 0.3 (ref 0–0.9)
MPV: 9.3 fL (ref 0–99.8)
POC MID %: 6.5 %M (ref 0–12)
Platelet Count, POC: 400 10*3/uL (ref 142–424)
RBC: 3.86 M/uL — AB (ref 4.04–5.48)
WBC: 4.4 10*3/uL — AB (ref 4.6–10.2)

## 2013-01-23 MED ORDER — OXAPROZIN 600 MG PO TABS: ORAL_TABLET | ORAL | Status: DC

## 2013-01-23 MED ORDER — METHOCARBAMOL 500 MG PO TABS: ORAL_TABLET | ORAL | Status: DC

## 2013-01-23 NOTE — Progress Notes (Signed)
Subjective: 42 year old lady who is here with a complaint of pain in her right back. He's been hurting her for about 2 weeks. It's a deep hurting below the right scapula. She does not know what caused it started happening. When she sits or stands in a certain position it will start giving her a deep aching. She bends forward to try and get relief from it. She has not been taking any medicine. She does not have menstrual cycles because she's had a partial hysterectomy she denies any nausea vomiting or abdominal bloating. She's not on any regular medicines.  Objective: Points to below her right scapula is in place of origin of the pain. Good range of motion of her spine. Nontender to percussion. Nontender to palpation. Does not have any rashes. Abdomen soft without masses or tenderness. Straight leg raise test negative. When she just sits on the exam table she starts feeling the discomfort coming on.  Assessment: Right mid back pain, atypical, etiology unclear.  Plan: Dorsal spine series CBC Cmet  [facility] reading (PRIMARY) by  Dr. Alwyn Ren Normal t spine  Results for orders placed in visit on 01/23/13  POCT CBC      Result Value Range   WBC 4.4 (*) 4.6 - 10.2 K/uL   Lymph, poc 1.9  0.6 - 3.4   POC LYMPH PERCENT 42.6  10 - 50 %L   MID (cbc) 0.3  0 - 0.9   POC MID % 6.5  0 - 12 %M   POC Granulocyte 2.2  2 - 6.9   Granulocyte percent 50.9  37 - 80 %G   RBC 3.86 (*) 4.04 - 5.48 M/uL   Hemoglobin 11.5 (*) 12.2 - 16.2 g/dL   HCT, POC 16.1 (*) 09.6 - 47.9 %   MCV 92.1  80 - 97 fL   MCH, POC 29.8  27 - 31.2 pg   MCHC 32.3  31.8 - 35.4 g/dL   RDW, POC 04.5     Platelet Count, POC 400  142 - 424 K/uL   MPV 9.3  0 - 99.8 fL   .  Marland Kitchen assessment: Back pain, etiology unclear Anemia  Plan: Muscle relaxants, anti-inflammatory medication, and she is to take some iron.

## 2013-01-23 NOTE — Patient Instructions (Signed)
Take the muscle relaxant and anti-inflammatory medication as ordered for your back. If you consider out any activity that specifically seems to aggravate it, you should try to minimize that activity.  Take iron over-the-counter one daily for about 3 months. Advise getting your blood rechecked after that.  Return if not improving

## 2013-01-25 ENCOUNTER — Encounter: Payer: Self-pay | Admitting: *Deleted

## 2013-01-28 IMAGING — US US SOFT TISSUE HEAD/NECK
1 series · 14 of 25 positions shown · non-contrast
Comparison: None

CLINICAL DATA: Thyromegaly

THYROID ULTRASOUND
TECHNIQUE: Ultrasound examination of the thyroid gland and adjacent
soft tissues was performed.

[Series 1: us soft tissue head/neck · 0.08mm/px · 14 of 40 slices shown]
[im 1/40]
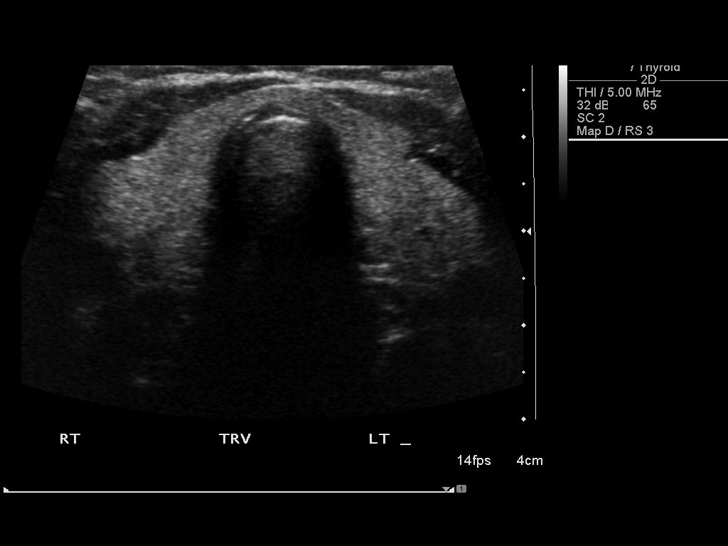
[im 4/40]
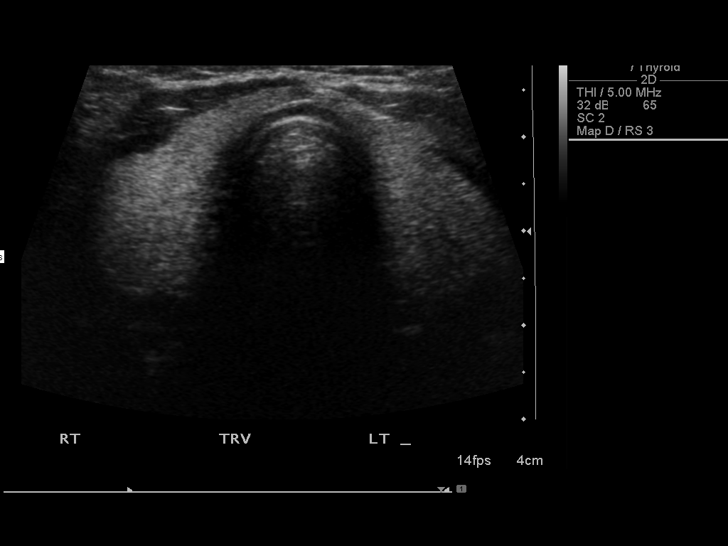
[im 7/40]
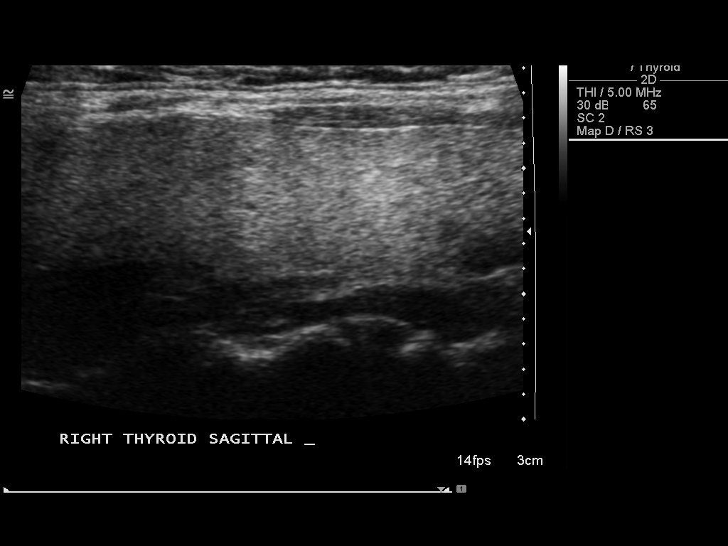
[im 10/40]
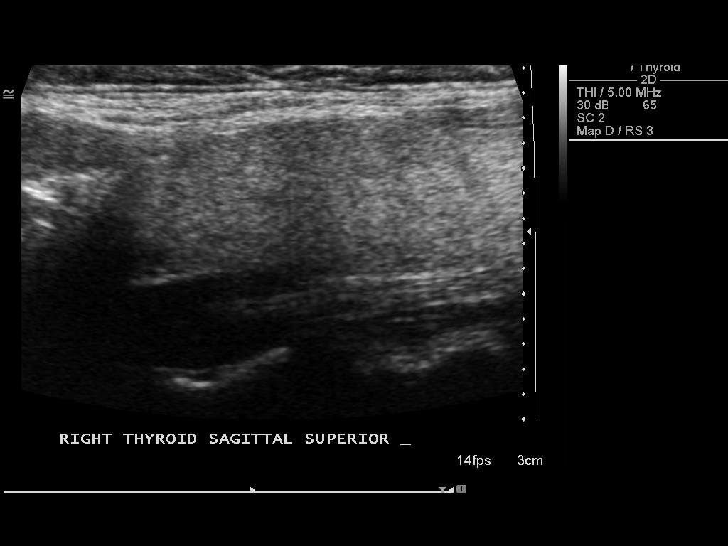
[im 14/40]
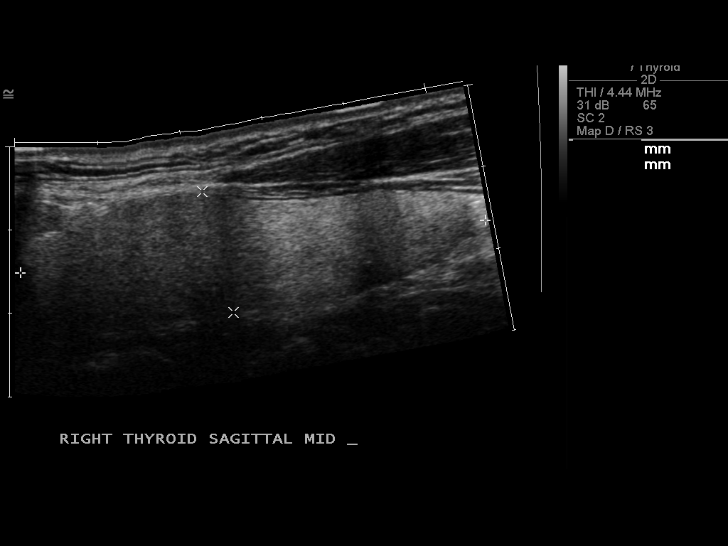
[im 15/40]
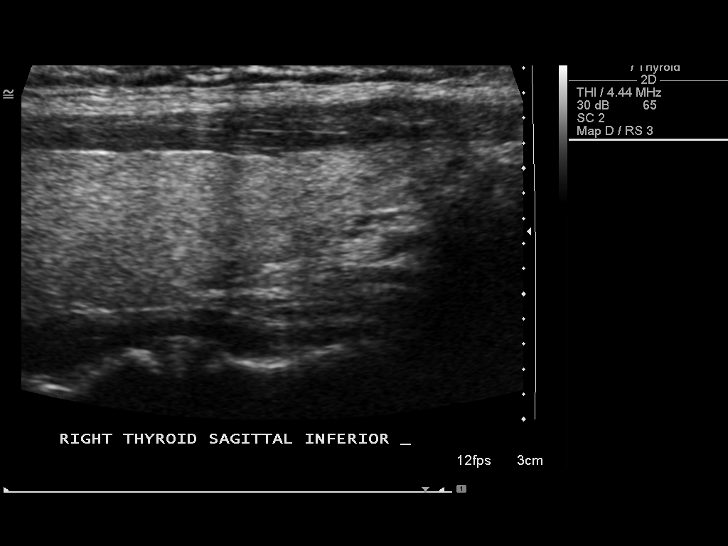
[im 18/40]
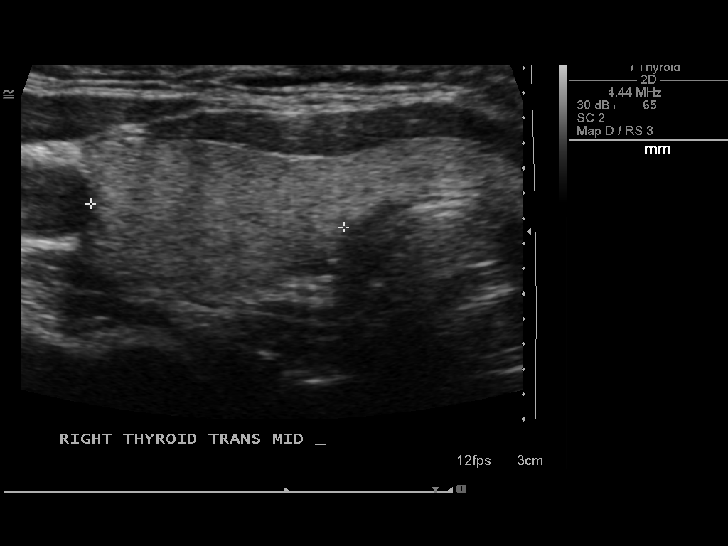
[im 22/40]
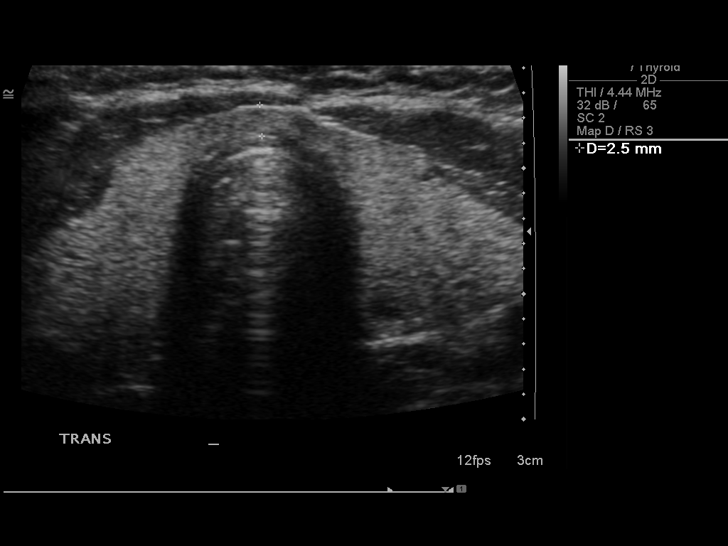
[im 25/40]
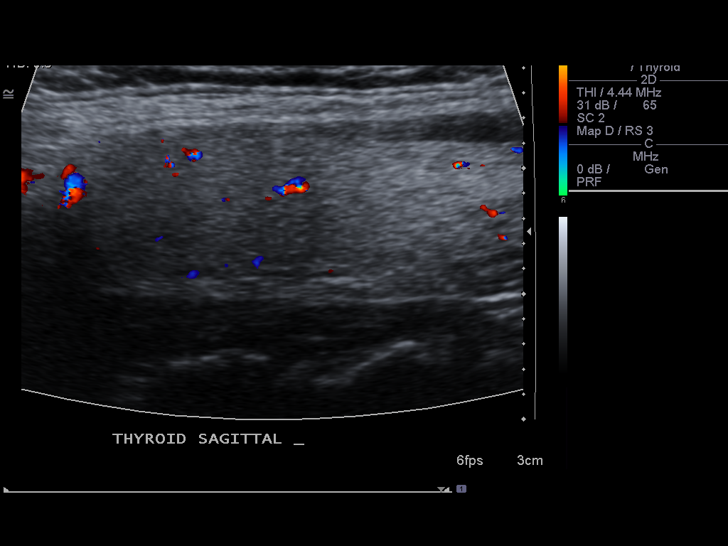
[im 27/40]
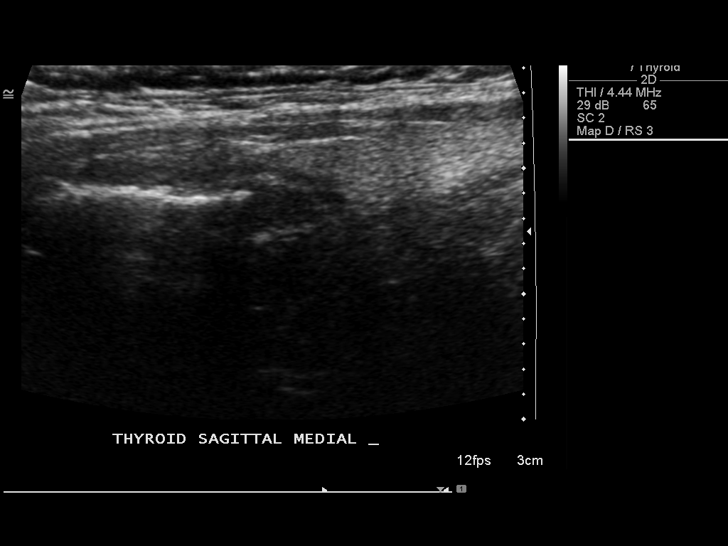
[im 30/40]
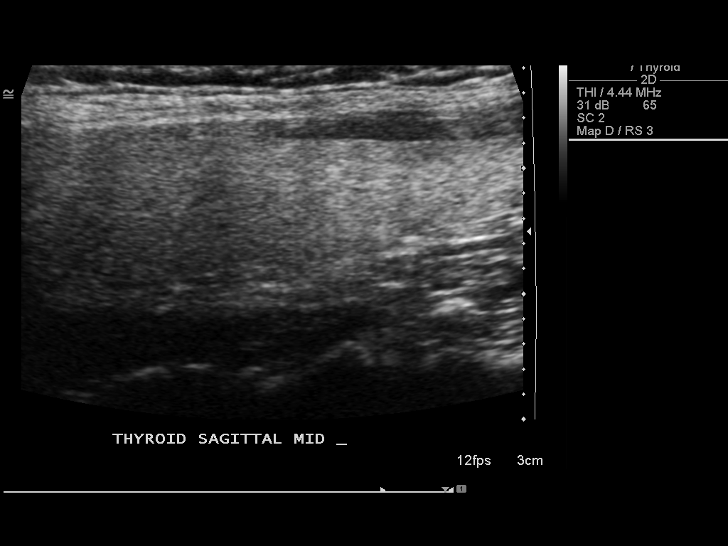
[im 33/40]
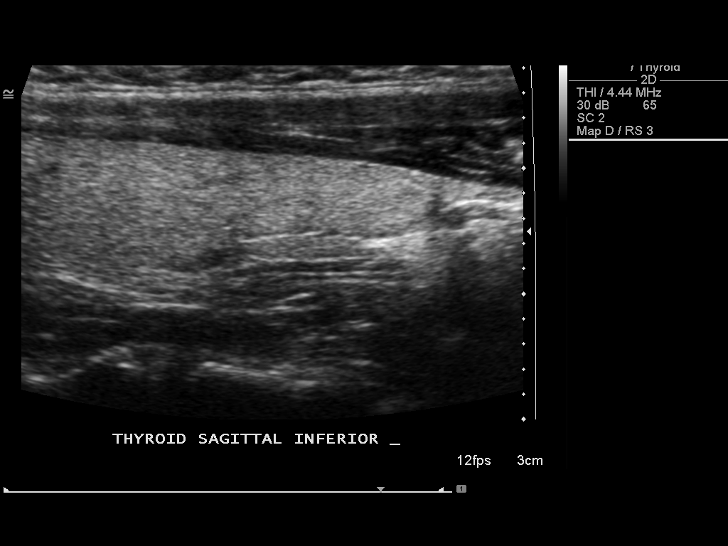
[im 36/40]
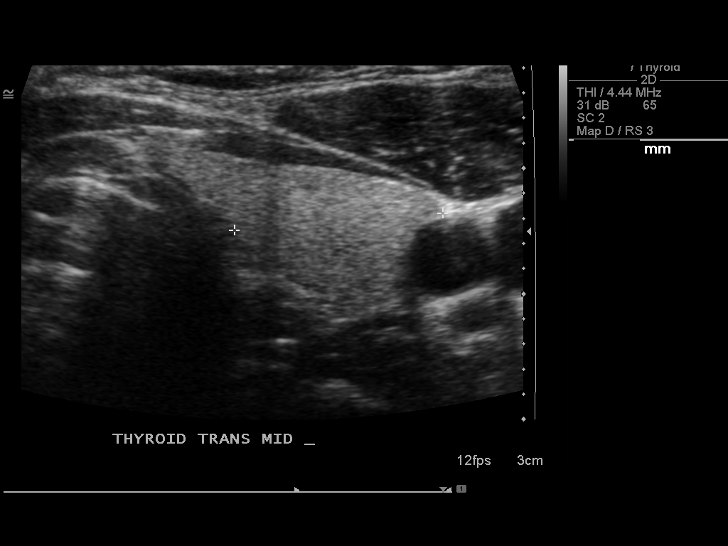
[im 40/40]
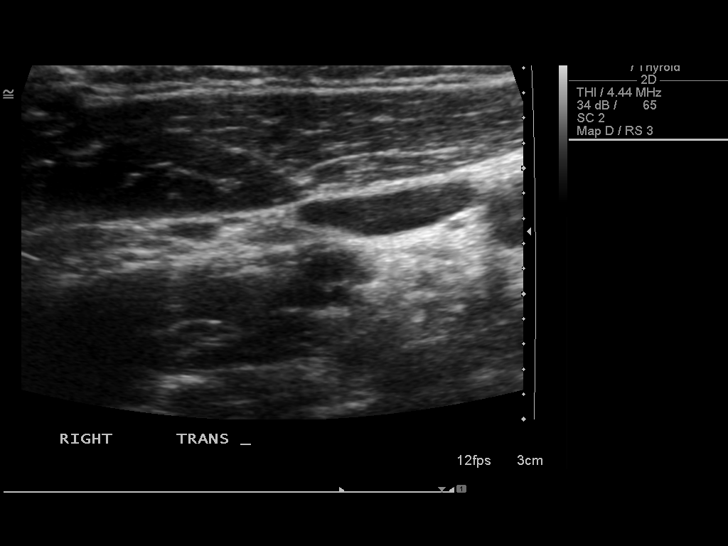

[14 of 25 positions shown; findings below may reference images not displayed]

FINDINGS: Right thyroid lobe:  5.6 x 1.5 x 2.0 cm.  Normal parenchymal
echogenicity.
Left thyroid lobe:  5.3 x 1.1 x 1.7 cm.  Normal parenchymal
echogenicity.
Isthmus:  3 mm thick

Focal nodules:  No focal solid mass lesions, cysts or
calcifications.

Lymphadenopathy:  None identified
IMPRESSION: Unremarkable thyroid ultrasound.

## 2013-12-05 ENCOUNTER — Ambulatory Visit (INDEPENDENT_AMBULATORY_CARE_PROVIDER_SITE_OTHER): Payer: BC Managed Care – PPO | Admitting: Family Medicine

## 2013-12-05 VITALS — BP 126/80 | HR 99 | Temp 99.5°F | Resp 17 | Ht 61.5 in | Wt 179.0 lb

## 2013-12-05 DIAGNOSIS — J209 Acute bronchitis, unspecified: Secondary | ICD-10-CM

## 2013-12-05 DIAGNOSIS — R05 Cough: Secondary | ICD-10-CM

## 2013-12-05 DIAGNOSIS — R059 Cough, unspecified: Secondary | ICD-10-CM

## 2013-12-05 MED ORDER — AZITHROMYCIN 250 MG PO TABS: ORAL_TABLET | ORAL | Status: DC

## 2013-12-05 MED ORDER — HYDROCODONE-HOMATROPINE 5-1.5 MG/5ML PO SYRP: ORAL_SOLUTION | ORAL | Status: DC

## 2013-12-05 NOTE — Progress Notes (Signed)
Subjective:    Patient ID: Jennifer Tate, female    DOB: [birth year], 43 y.o.   MRN: [id number]  HPI Jennifer Tate is a 43 y.o. female Cold symptoms for about 2 weeks - sniffling, runny nose, sneezing, but now past 2-3 days more deep cough, intermittent clear phlegm. Hurts in throat to cough, sore at times in chest with cough only. No dyspnea. No fever. No myalgias. Eating/drinking ok.   No known sick contacts.   Tx: mucinex last night, slept some with this.   No flu vaccine this year.  Review of Systems  Constitutional: Negative for fever and chills.  HENT: Positive for sore throat (with cough only. ). Negative for sinus pressure.   Respiratory: Positive for cough. Negative for shortness of breath.   Cardiovascular: Negative for chest pain (more throat with cough. ).  Skin: Negative for rash.   As above.     Objective:   Physical Exam  Vitals reviewed. Constitutional: She is oriented to person, place, and time. She appears well-developed and well-nourished. No distress.  HENT:  Head: Normocephalic and atraumatic.  Right Ear: Hearing, tympanic membrane, external ear and ear canal normal.  Left Ear: Hearing, tympanic membrane, external ear and ear canal normal.  Nose: Nose normal.  Mouth/Throat: Oropharynx is clear and moist. No oropharyngeal exudate.  Eyes: Conjunctivae and EOM are normal. Pupils are equal, round, and reactive to light.  Cardiovascular: Normal rate, regular rhythm, normal heart sounds and intact distal pulses.   No murmur heard. Pulmonary/Chest: Effort normal and breath sounds normal. No respiratory distress. She has no wheezes. She has no rhonchi.  Neurological: She is alert and oriented to person, place, and time.  Skin: Skin is warm and dry. No rash noted.  Psychiatric: She has a normal mood and affect. Her behavior is normal.    Filed Vitals:   12/05/13 0904  BP: 126/80  Pulse: 99  Temp: 99.5 F (37.5 C)  TempSrc: Oral  Resp: 17  Height: 5'  1.5" (1.562 m)  Weight: 179 lb (81.194 kg)  SpO2: 97%      Assessment & Plan:   [name] is a 43 y.o. female Acute bronchitis - Plan: HYDROcodone-homatropine (HYCODAN) 5-1.5 MG/5ML syrup, azithromycin (ZITHROMAX) 250 MG tablet  Cough - Plan: HYDROcodone-homatropine (HYCODAN) 5-1.5 MG/5ML syrup, azithromycin (ZITHROMAX) 250 MG tablet  2 weeks of sx's now with slight interval worsening - early bronchitis. Sx care as discussed below, start Zpak, hycodan if needed at night - SED. Mucinex. Note OOW if needed tomorrow, rtc precautions including fever, dyspnea, or worsening sx's discussed.   Meds ordered this encounter  Medications  . HYDROcodone-homatropine (HYCODAN) 5-1.5 MG/5ML syrup    Sig: 59m by mouth a bedtime as needed for cough.    Dispense:  120 mL    Refill:  0  . azithromycin (ZITHROMAX) 250 MG tablet    Sig: Take 2 pills by mouth on day 1, then 1 pill by mouth per day on days 2 through 5.    Dispense:  6 each    Refill:  0   Patient Instructions  Over the counter mucinex or mucinex DM during the day for cough, Hycodan cough syrup at night if needed. drink plenty of fluids. Return to the clinic or go to the nearest emergency room if any of your symptoms worsen or new symptoms occur. Bronchitis Bronchitis is the body's way of reacting to injury and/or infection (inflammation) of the bronchi. Bronchi are the air tubes  that extend from the windpipe into the lungs. If the inflammation becomes severe, it may cause shortness of breath. CAUSES  Inflammation may be caused by:  A virus.  Germs (bacteria).  Dust.  Allergens.  Pollutants and many other irritants. The cells lining the bronchial tree are covered with tiny hairs (cilia). These constantly beat upward, away from the lungs, toward the mouth. This keeps the lungs free of pollutants. When these cells become too irritated and are unable to do their job, mucus begins to develop. This causes the characteristic cough of  bronchitis. The cough clears the lungs when the cilia are unable to do their job. Without either of these protective mechanisms, the mucus would settle in the lungs. Then you would develop pneumonia. Smoking is a common cause of bronchitis and can contribute to pneumonia. Stopping this habit is the single most important thing you can do to help yourself. TREATMENT   Your caregiver may prescribe an antibiotic if the cough is caused by bacteria. Also, medicines that open up your airways make it easier to breathe. Your caregiver may also recommend or prescribe an expectorant. It will loosen the mucus to be coughed up. Only take over-the-counter or prescription medicines for pain, discomfort, or fever as directed by your caregiver.  Removing whatever causes the problem (smoking, for example) is critical to preventing the problem from getting worse.  Cough suppressants may be prescribed for relief of cough symptoms.  Inhaled medicines may be prescribed to help with symptoms now and to help prevent problems from returning.  For those with recurrent (chronic) bronchitis, there may be a need for steroid medicines. SEEK IMMEDIATE MEDICAL CARE IF:   During treatment, you develop more pus-like mucus (purulent sputum).  You have a fever.  You become progressively more ill.  You have increased difficulty breathing, wheezing, or shortness of breath. It is necessary to seek immediate medical care if you are elderly or sick from any other disease. MAKE SURE YOU:   Understand these instructions.  Will watch your condition.  Will get help right away if you are not doing well or get worse. Document Released: 11/21/2005 Document Revised: 07/24/2013 Document Reviewed: 07/16/2013 [facility] Patient Information 2014 [place].

## 2013-12-05 NOTE — Patient Instructions (Signed)
Over the counter mucinex or mucinex DM during the day for cough, Hycodan cough syrup at night if needed. drink plenty of fluids. Return to the clinic or go to the nearest emergency room if any of your symptoms worsen or new symptoms occur. Bronchitis Bronchitis is the body's way of reacting to injury and/or infection (inflammation) of the bronchi. Bronchi are the air tubes that extend from the windpipe into the lungs. If the inflammation becomes severe, it may cause shortness of breath. CAUSES  Inflammation may be caused by:  A virus.  Germs (bacteria).  Dust.  Allergens.  Pollutants and many other irritants. The cells lining the bronchial tree are covered with tiny hairs (cilia). These constantly beat upward, away from the lungs, toward the mouth. This keeps the lungs free of pollutants. When these cells become too irritated and are unable to do their job, mucus begins to develop. This causes the characteristic cough of bronchitis. The cough clears the lungs when the cilia are unable to do their job. Without either of these protective mechanisms, the mucus would settle in the lungs. Then you would develop pneumonia. Smoking is a common cause of bronchitis and can contribute to pneumonia. Stopping this habit is the single most important thing you can do to help yourself. TREATMENT   Your caregiver may prescribe an antibiotic if the cough is caused by bacteria. Also, medicines that open up your airways make it easier to breathe. Your caregiver may also recommend or prescribe an expectorant. It will loosen the mucus to be coughed up. Only take over-the-counter or prescription medicines for pain, discomfort, or fever as directed by your caregiver.  Removing whatever causes the problem (smoking, for example) is critical to preventing the problem from getting worse.  Cough suppressants may be prescribed for relief of cough symptoms.  Inhaled medicines may be prescribed to help with symptoms now  and to help prevent problems from returning.  For those with recurrent (chronic) bronchitis, there may be a need for steroid medicines. SEEK IMMEDIATE MEDICAL CARE IF:   During treatment, you develop more pus-like mucus (purulent sputum).  You have a fever.  You become progressively more ill.  You have increased difficulty breathing, wheezing, or shortness of breath. It is necessary to seek immediate medical care if you are elderly or sick from any other disease. MAKE SURE YOU:   Understand these instructions.  Will watch your condition.  Will get help right away if you are not doing well or get worse. Document Released: 11/21/2005 Document Revised: 07/24/2013 Document Reviewed: 07/16/2013 [facility] Patient Information 2014 [place].

## 2015-09-02 ENCOUNTER — Ambulatory Visit (INDEPENDENT_AMBULATORY_CARE_PROVIDER_SITE_OTHER): Payer: BLUE CROSS/BLUE SHIELD | Admitting: Physician Assistant

## 2015-09-02 ENCOUNTER — Encounter: Payer: Self-pay | Admitting: Physician Assistant

## 2015-09-02 VITALS — BP 127/83 | HR 87 | Temp 98.4°F | Resp 16 | Ht 61.4 in | Wt 182.8 lb

## 2015-09-02 DIAGNOSIS — H6123 Impacted cerumen, bilateral: Secondary | ICD-10-CM | POA: Diagnosis not present

## 2015-09-02 NOTE — Progress Notes (Signed)
   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  SUBJECTIVE  Jennifer Tate is a 44 y.o. well appearing female presenting for the chief complaint of "left ear being blocked up" and a change in hearing on the left side which she describes as muffled. Reports this started on 5 days ago after she accidentally push some ear wax toward her ear with her fingernail.  Denies fever, chills, ear pain, throat pain, and nasal symptoms.      She  has a past medical history of Anemia and No pertinent past medical history.    Medications reviewed and updated by myself where necessary, and exist elsewhere in the encounter.   Jennifer Tate has No Known Allergies. She  reports that she has never smoked. She does not have any smokeless tobacco history on file. She reports that she does not drink alcohol or use illicit drugs. She  reports that she currently engages in sexual activity. She reports using the following method of birth control/protection: None. The patient  has past surgical history that includes Carpal tunnel release; Cesarean section; Tubal ligation; and Laparoscopic assisted vaginal hysterectomy (12/08/2011).  Her family history includes Kidney disease in her maternal grandmother.  ROS  Per HPI  OBJECTIVE  Her  height is 5' 1.4" (1.56 m) and weight is 182 lb 12.8 oz (82.918 kg). Her oral temperature is 98.4 F (36.9 C). Her blood pressure is 127/83 and her pulse is 87. Her respiration is 16.  The patient's body mass index is 34.07 kg/(m^2).  Physical Exam  Constitutional: She is oriented to person, place, and time.  HENT:  Right Ear: Hearing and external ear normal.  Left Ear: External ear normal.  Ears:  Nose: Mucosal edema (bluish hue) present.  Mouth/Throat: Uvula is midline and oropharynx is clear and moist.  Eyes: EOM are normal. Pupils are equal, round, and  reactive to light.  Cardiovascular: Normal rate.   Respiratory: Effort normal.  Neurological: She is alert and oriented to person, place, and time.    No results found for this or any previous visit (from the past 24 hour(s)).  ASSESSMENT & PLAN  Jennifer Tate was seen today for left ear feels blocked.  Diagnoses and all orders for this visit:  Cerumen impaction, bilateral: Post lavage both ears clear and TM pearly grey.  Hearing normal bilaterally.  -     Ear wax removal   The patient was advised to call or come back to clinic if she does not see an improvement in symptoms, or worsens with the above plan.   Philis Fendt, MHS, PA-C [facility] 09/02/2015 1:35 PM

## 2016-01-11 ENCOUNTER — Other Ambulatory Visit: Payer: Self-pay

## 2016-01-11 ENCOUNTER — Encounter: Payer: Self-pay | Admitting: Podiatry

## 2016-01-11 ENCOUNTER — Ambulatory Visit (INDEPENDENT_AMBULATORY_CARE_PROVIDER_SITE_OTHER): Payer: BLUE CROSS/BLUE SHIELD | Admitting: Podiatry

## 2016-01-11 VITALS — BP 128/76 | HR 81 | Ht 59.0 in | Wt 180.0 lb

## 2016-01-11 DIAGNOSIS — M216X1 Other acquired deformities of right foot: Secondary | ICD-10-CM | POA: Diagnosis not present

## 2016-01-11 DIAGNOSIS — M722 Plantar fascial fibromatosis: Secondary | ICD-10-CM | POA: Diagnosis not present

## 2016-01-11 DIAGNOSIS — M216X2 Other acquired deformities of left foot: Secondary | ICD-10-CM

## 2016-01-11 DIAGNOSIS — M21969 Unspecified acquired deformity of unspecified lower leg: Secondary | ICD-10-CM | POA: Diagnosis not present

## 2016-01-11 NOTE — Patient Instructions (Signed)
Seen for left heel pain. Noted of unstable first metatarsal left. Metatarsal binder (sm) dispensed x 1. May benefit from custom orthotics.

## 2016-01-11 NOTE — Progress Notes (Signed)
SUBJECTIVE: 45 y.o. year old female presents complaining of pain in right heel off and on for over a year. She works for [employer] and on feet much especially in the morning.   REVIEW OF SYSTEMS: Pertinent items noted in HPI and remainder of comprehensive ROS otherwise negative.  OBJECTIVE: DERMATOLOGIC EXAMINATION: No abnormal skin lesions noted. VASCULAR EXAMINATION OF LOWER LIMBS: Pedal pulses: All pedal pulses are palpable with normal pulsation.  Capillary Filling times within 3 seconds in all digits.  No edema or erythema noted. Temperature gradient from tibial crest to dorsum of foot is within normal bilateral.  NEUROLOGIC EXAMINATION OF THE LOWER LIMBS: All epicritic and tactile sensations intact and normal.  MUSCULOSKELETAL EXAMINATION: Positive for excess sagittal plane motion of the first ray L>R. Forefoot varus and STJ hyperpronation with loading of foot. Pain in left heel with direct pressure.  RADIOGRAPHIC STUDIES:  General overview: Negative of Osteopenia Absence of soft tissue swelling. Absence of acute osseous or articular surfaces of forefoot or rearfoot.  AP View:  Long first metatarsal without other gross deformities. Lateral view:  Pronated foot with severely elevated first metatarsal L>R. Positive of plantar calcaneal spur left.  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  ASSESSMENT: 1. Plantar fasciitis left. 2. Elevated first ray bilateral L>R. 3. STJ hyperpronation bilateral.  PLAN: Reviewed clinical findings and available treatment options. Patient wants to postpone injection. Metatarsal binder (sm) dispensed x 1. Need custom orthotics.

## 2018-02-20 ENCOUNTER — Ambulatory Visit (HOSPITAL_COMMUNITY)
Admission: EM | Admit: 2018-02-20 | Discharge: 2018-02-20 | Disposition: A | Discharge status: Home / Self Care | Payer: Self-pay | Attending: Family Medicine | Admitting: Family Medicine

## 2018-02-20 ENCOUNTER — Encounter (HOSPITAL_COMMUNITY): Payer: Self-pay | Admitting: Emergency Medicine

## 2018-02-20 DIAGNOSIS — G44319 Acute post-traumatic headache, not intractable: Secondary | ICD-10-CM

## 2018-02-20 MED ORDER — BUTALBITAL-APAP-CAFFEINE 50-325-40 MG PO TABS: 1.0000 | ORAL_TABLET | Freq: Four times a day (QID) | ORAL | 0 refills | Status: AC | PRN
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
## 2019-07-31 ENCOUNTER — Other Ambulatory Visit: Payer: Self-pay

## 2019-07-31 DIAGNOSIS — Z20822 Contact with and (suspected) exposure to covid-19: Secondary | ICD-10-CM

## 2019-08-01 LAB — NOVEL CORONAVIRUS, NAA: SARS-CoV-2, NAA: NOT DETECTED

## 2021-10-30 ENCOUNTER — Encounter: Payer: Self-pay | Admitting: Hematology and Oncology

## 2021-10-30 ENCOUNTER — Other Ambulatory Visit: Payer: Self-pay

## 2021-10-30 ENCOUNTER — Ambulatory Visit
Admission: EM | Admit: 2021-10-30 | Discharge: 2021-10-30 | Disposition: A | Discharge status: Home / Self Care | Payer: BC Managed Care – PPO | Attending: Physician Assistant | Admitting: Physician Assistant

## 2021-10-30 DIAGNOSIS — J40 Bronchitis, not specified as acute or chronic: Secondary | ICD-10-CM

## 2021-10-30 MED ORDER — PREDNISONE 20 MG PO TABS: 40.0000 mg | ORAL_TABLET | Freq: Every day | ORAL | 0 refills | Status: AC

## 2021-10-30 NOTE — ED Triage Notes (Signed)
2 week h/o cough and intermittent sore throat and hoarseness. Two days of chills, HA and 1 day of fatigue that has resolved. Today, Pt mainly c/o lingering cough that worsens at night. Has been taking theraflu, other otc meds and drinking teas with relief. Denies dysphagia, v/d. Pt is covid vaccinated but not boosted.

## 2021-10-30 NOTE — ED Provider Notes (Signed)
[facility]    CSN: [id number] Arrival date & time: 10/30/21  7564      History   Chief Complaint Chief Complaint  Patient presents with   Cough    HPI Jennifer Tate is a 50 y.o. female.   Patient here today for evaluation of 2-week history of cough.  She reports she is also developed some hoarseness.  Initially she did have a sore scratchy throat but this is resolved.  She did also have fevers initially but these have also resolved.  Cough seems to be worse at nighttime.  She denies any vomiting or diarrhea.  She has tried over-the-counter treatment as well as warm teas with some relief of symptoms.  The history is provided by the patient.  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      Home Medications    Prior to Admission medications   Medication Sig Start Date End Date Taking? Authorizing Provider  predniSONE (DELTASONE) 20 MG tablet Take 2 tablets (40 mg total) by mouth daily with breakfast for 5 days. 10/30/21 11/04/21 Yes Francene Finders, PA-C    Family History Family History  Problem Relation Age of Onset   Kidney disease Maternal  Grandmother     Social History Social History   Tobacco Use   Smoking status: Never   Smokeless tobacco: Never  Substance Use Topics   Alcohol use: No    Alcohol/week: 0.0 standard drinks   Drug use: No     Allergies   Patient has no known allergies.   Review of Systems Review of Systems  Constitutional:  Negative for chills and fever.  HENT:  Positive for congestion and voice change. Negative for ear pain and sore throat.   Eyes:  Negative for discharge and redness.  Respiratory:  Positive for cough. Negative for shortness of breath and wheezing.   Gastrointestinal:  Negative for abdominal pain, diarrhea, nausea and vomiting.    Physical Exam Triage Vital Signs ED Triage Vitals  Enc Vitals Group     BP 10/30/21 1150 127/85     Pulse Rate 10/30/21 1150 90     Resp 10/30/21 1150 18     Temp 10/30/21 1150 98.2 F (36.8 C)     Temp Source 10/30/21 1150 Oral     SpO2 10/30/21 1150 95 %     Weight --      Height --      Head Circumference --      Peak Flow --      Pain Score 10/30/21 1158 0  Pain Loc --      Pain Edu? --      Excl. in [place]? --    No data found.  Updated Vital Signs BP 127/85 (BP Location: Left Arm)   Pulse 90   Temp 98.2 F (36.8 C) (Oral)   Resp 18   LMP 12/07/2011   SpO2 95%      Physical Exam Vitals and nursing note reviewed.  Constitutional:      General: She is not in acute distress.    Appearance: Normal appearance. She is not ill-appearing.  HENT:     Head: Normocephalic and atraumatic.     Nose: Nose normal. No congestion or rhinorrhea.     Mouth/Throat:     Mouth: Mucous membranes are moist.     Pharynx: No oropharyngeal exudate or posterior oropharyngeal erythema.  Eyes:     Conjunctiva/sclera: Conjunctivae normal.  Cardiovascular:     Rate and Rhythm: Normal rate and regular rhythm.     Heart sounds: Normal heart sounds. No murmur heard. Pulmonary:     Effort: Pulmonary effort is normal. No respiratory distress.      Breath sounds: Normal breath sounds. No wheezing, rhonchi or rales.  Skin:    General: Skin is warm and dry.  Neurological:     Mental Status: She is alert.  Psychiatric:        Mood and Affect: Mood normal.        Thought Content: Thought content normal.     UC Treatments / Results  Labs (all labs ordered are listed, but only abnormal results are displayed) Labs Reviewed - No data to display  EKG   Radiology No results found.  Procedures Procedures (including critical care time)  Medications Ordered in UC Medications - No data to display  Initial Impression / Assessment and Plan / UC Course  I have reviewed the triage vital signs and the nursing notes.  Pertinent labs & imaging results that were available during my care of the patient were reviewed by me and considered in my medical decision making (see chart for details).    Suspect likely bronchitis and will treat with steroid burst.  Recommended follow-up if symptoms fail to improve or worsen.  Final Clinical Impressions(s) / UC Diagnoses   Final diagnoses:  Bronchitis   Discharge Instructions   None    ED Prescriptions     Medication Sig Dispense Auth. Provider   predniSONE (DELTASONE) 20 MG tablet Take 2 tablets (40 mg total) by mouth daily with breakfast for 5 days. 10 tablet Francene Finders, PA-C      PDMP not reviewed this encounter.   Francene Finders, PA-C 10/30/21 1310

## 2024-02-22 ENCOUNTER — Ambulatory Visit
Admission: RE | Admit: 2024-02-22 | Discharge: 2024-02-22 | Disposition: A | Discharge status: Home / Self Care | Payer: Self-pay | Source: Ambulatory Visit

## 2024-02-22 ENCOUNTER — Ambulatory Visit: Admitting: Radiology

## 2024-02-22 VITALS — BP 148/95 | HR 110 | Temp 98.8°F | Resp 17

## 2024-02-22 DIAGNOSIS — R053 Chronic cough: Secondary | ICD-10-CM

## 2024-02-22 DIAGNOSIS — B349 Viral infection, unspecified: Secondary | ICD-10-CM

## 2024-02-22 MED ORDER — BENZONATATE 200 MG PO CAPS: 200.0000 mg | ORAL_CAPSULE | Freq: Three times a day (TID) | ORAL | 0 refills | Status: AC | PRN

## 2024-02-22 MED ORDER — IBUPROFEN 800 MG PO TABS: 800.0000 mg | ORAL_TABLET | Freq: Three times a day (TID) | ORAL | 0 refills | Status: AC

## 2024-02-22 MED ORDER — AZELASTINE HCL 0.1 % NA SOLN: 1.0000 | Freq: Two times a day (BID) | NASAL | 1 refills | Status: AC

## 2024-02-22 NOTE — ED Triage Notes (Signed)
 Pt was sick with cough, runny nose and scratchy throat about 3 weeks ago. Today she presents for persistent cough that wont go away and is worse at night.

## 2024-02-22 NOTE — ED Provider Notes (Signed)
 [facility]  Note:  This document was prepared using Dragon voice recognition software and may include unintentional dictation errors.  MRN: [id number] DOB: 05/01/1971  Subjective:   Jennifer Tate is a 53 y.o. female presenting for persistent cough, nasal congestion, scratchy throat x 3 weeks.  Patient reports that cough is much worse at night and mucus production first thing in the morning is more significant.  No chest pain, shortness of breath, wheezing, dizziness, diarrhea, nausea/vomiting.  Patient states that she has not been taking any over-the-counter medication to treat symptoms.  Patient states she recently went to [place] to visit her sister and when she came back her cough became worse.  Denies any known sick exposures  No current facility-administered medications for this encounter.  Current Outpatient Medications:    azelastine (ASTELIN) 0.1 % nasal spray, Place 1 spray into both nostrils 2 (two) times daily. Use in each nostril as directed, Disp: 30 mL, Rfl: 1   benzonatate (TESSALON) 200 MG capsule, Take 1 capsule (200 mg total) by mouth 3 (three) times daily as needed for cough., Disp: 20 capsule, Rfl: 0   ibuprofen (ADVIL) 800 MG tablet, Take 1 tablet (800 mg total) by mouth 3 (three) times daily., Disp: 21 tablet, Rfl: 0   No Known Allergies  Past Medical History:  Diagnosis Date   Anemia    No pertinent past medical history      Past Surgical History:  Procedure Laterality Date   CARPAL TUNNEL RELEASE     CESAREAN SECTION     x 2   LAPAROSCOPIC ASSISTED VAGINAL HYSTERECTOMY  12/08/2011   Procedure: LAPAROSCOPIC ASSISTED VAGINAL HYSTERECTOMY;  Surgeon: Turner Daniels, MD;  Location: [facility];  Service: Gynecology;  Laterality: N/A;   TUBAL LIGATION      Family History  Problem Relation Age of Onset   Kidney disease Maternal Grandmother     Social History   Tobacco Use   Smoking status: Never   Smokeless tobacco: Never  Substance Use  Topics   Alcohol use: No    Alcohol/week: 0.0 standard drinks of alcohol   Drug use: No    ROS Refer to HPI for ROS details.  Objective:   Vitals: BP (!) 148/95 (BP Location: Right Arm)   Pulse (!) 110   Temp 98.8 F (37.1 C) (Oral)   Resp 17   LMP 12/07/2011   SpO2 95%   Physical Exam Vitals and nursing note reviewed.  Constitutional:      General: She is not in acute distress.    Appearance: Normal appearance. She is well-developed. She is not ill-appearing or toxic-appearing.  HENT:     Head: Normocephalic.     Nose: Rhinorrhea present. No congestion.     Mouth/Throat:     Mouth: Mucous membranes are moist.     Pharynx: Oropharynx is clear.  Eyes:     Extraocular Movements: Extraocular movements intact.     Conjunctiva/sclera: Conjunctivae normal.  Cardiovascular:     Rate and Rhythm: Normal rate and regular rhythm.     Heart sounds: No murmur heard. Pulmonary:     Effort: Pulmonary effort is normal. No respiratory distress.     Breath sounds: Normal breath sounds. No stridor. No wheezing, rhonchi or rales.  Skin:    General: Skin is warm and dry.  Neurological:     General: No focal deficit present.     Mental Status: She is alert and oriented to person, place, and time.  Psychiatric:        Mood and Affect: Mood normal.        Behavior: Behavior normal.     Procedures  No results found for this or any previous visit (from the past 24 hours).  Assessment and Plan :   PDMP not reviewed this encounter.  1. Acute viral syndrome   2. Persistent cough for 3 weeks or longer    1. Acute viral syndrome (Primary) - benzonatate (TESSALON) 200 MG capsule; Take 1 capsule (200 mg total) by mouth 3 (three) times daily as needed for cough.   - azelastine (ASTELIN) 0.1 % nasal spray; Place 1 spray into both nostrils 2 (two) times daily. Use in each nostril as directed   - ibuprofen (ADVIL) 800 MG tablet; Take 1 tablet (800 mg total) by mouth 3 (three) times  daily. -Continue to monitor symptoms for any change in severity if there is any escalation of current symptoms or development of new symptoms return to urgent care or follow-up in ER for further evaluation and management.  2. Persistent cough for 3 weeks or longer - DG Chest 2 View x-ray performed in UC shows no acute cardiopulmonary processes, no significant sign validation or pneumonia.  Final radiologist read still pending if there is any abnormality noted on final report he will be contacted and appropriate treatment provided.  Lucky Cowboy   [place], [place], [place] 02/22/24 [phone]

## 2024-02-22 NOTE — Discharge Instructions (Addendum)
 1. Acute viral syndrome (Primary) - benzonatate (TESSALON) 200 MG capsule; Take 1 capsule (200 mg total) by mouth 3 (three) times daily as needed for cough.   - azelastine (ASTELIN) 0.1 % nasal spray; Place 1 spray into both nostrils 2 (two) times daily. Use in each nostril as directed   - ibuprofen (ADVIL) 800 MG tablet; Take 1 tablet (800 mg total) by mouth 3 (three) times daily. -Continue to monitor symptoms for any change in severity if there is any escalation of current symptoms or development of new symptoms return to urgent care or follow-up in ER for further evaluation and management.  2. Persistent cough for 3 weeks or longer - DG Chest 2 View x-ray performed in UC shows no acute cardiopulmonary processes, no significant sign validation or pneumonia.  Final radiologist read still pending if there is any abnormality noted on final report he will be contacted and appropriate treatment provided.
